# Patient Record
Sex: Female | Born: 1990 | Race: White | Hispanic: No | Marital: Single | Smoking: Never smoker
Health system: Southern US, Community
[De-identification: ages and names within clinical notes are randomized; demographics above are authoritative.]

## PROBLEM LIST (undated history)

## (undated) DIAGNOSIS — F419 Anxiety disorder, unspecified: Secondary | ICD-10-CM

## (undated) DIAGNOSIS — F32A Depression, unspecified: Secondary | ICD-10-CM

## (undated) DIAGNOSIS — F329 Major depressive disorder, single episode, unspecified: Secondary | ICD-10-CM

## (undated) HISTORY — DX: Anxiety disorder, unspecified: F41.9

## (undated) HISTORY — DX: Major depressive disorder, single episode, unspecified: F32.9

## (undated) HISTORY — DX: Depression, unspecified: F32.A

---

## 2015-08-09 ENCOUNTER — Ambulatory Visit (INDEPENDENT_AMBULATORY_CARE_PROVIDER_SITE_OTHER): Payer: 59

## 2015-08-09 ENCOUNTER — Ambulatory Visit (INDEPENDENT_AMBULATORY_CARE_PROVIDER_SITE_OTHER): Payer: 59 | Admitting: Family Medicine

## 2015-08-09 VITALS — BP 130/100 | HR 103 | Temp 99.1°F | Resp 16 | Ht 62.0 in | Wt 199.4 lb

## 2015-08-09 DIAGNOSIS — M79602 Pain in left arm: Secondary | ICD-10-CM

## 2015-08-09 DIAGNOSIS — T148XXA Other injury of unspecified body region, initial encounter: Secondary | ICD-10-CM

## 2015-08-09 NOTE — Progress Notes (Signed)
Chief Complaint:  Chief Complaint  Patient presents with  . Arm Pain    C/O left arm pain since waking up this morning.     HPI: Alison Craig is a 24 y.o. female who reports to [facility] today complaining of left arm pain since this AM, woke up with it. She has had NKI, she has had a fracture when she was small in the same area. She works as a Museum/gallery conservator and has not had anything abnormal at work. She does some heavy lifting but had an easy day before all . Did have some numbness and tingling. No hx of carpal tunnel. She has iced it , she tries not to take advil due to GI sxs. She avoid NSAIDs due to gastritis. Right hand dominant.  Past Medical History  Diagnosis Date  . Anxiety   . Depression    No past surgical history on file. Social History   Social History  . Marital Status: Single    Spouse Name: N/A  . Number of Children: N/A  . Years of Education: N/A   Social History Main Topics  . Smoking status: Never Smoker   . Smokeless tobacco: None  . Alcohol Use: 0.0 oz/week    0 Standard drinks or equivalent per week  . Drug Use: No  . Sexual Activity: Not Asked   Other Topics Concern  . None   Social History Narrative  . None   Family History  Problem Relation Age of Onset  . Mental illness Mother   . Mental illness Brother    Allergies  Allergen Reactions  . Buspirone Rash  . Paxil [Paroxetine] Rash   Prior to Admission medications   Medication Sig Start Date End Date Taking? Authorizing Provider  clonazePAM (KLONOPIN) 0.5 MG tablet Take 0.5 mg by mouth 2 (two) times daily as needed for anxiety.   Yes Historical Provider, MD  sertraline (ZOLOFT) 100 MG tablet Take 150 mg by mouth daily.   Yes Historical Provider, MD  traZODone (DESYREL) 50 MG tablet Take 50 mg by mouth at bedtime.   Yes Historical Provider, MD     ROS: The patient denies fevers, chills, night sweats, unintentional weight loss, chest pain, palpitations, wheezing, dyspnea on exertion,  nausea, vomiting, abdominal pain, dysuria, hematuria, melena, + numbness, weakness, or tingling.   All other systems have been reviewed and were otherwise negative with the exception of those mentioned in the HPI and as above.    PHYSICAL EXAM: Filed Vitals:   08/09/15 1948  BP: 130/100  Pulse: 103  Temp: 99.1 F (37.3 C)  Resp: 16   Body mass index is 36.46 kg/(m^2).   General: Alert, no acute distress HEENT:  Normocephalic, atraumatic, oropharynx patent. EOMI, PERRLA Cardiovascular:  Regular rate and rhythm, no rubs murmurs or gallops.   Respiratory: Clear to auscultation bilaterally.  No wheezes, rales, or rhonchi.  No cyanosis, no use of accessory musculature Abdominal: No organomegaly, abdomen is soft and non-tender, positive bowel sounds. No masses. Skin: No rashes. Neurologic: Facial musculature symmetric. Psychiatric: Patient acts appropriately throughout our interaction. Lymphatic: No cervical or submandibular lymphadenopathy Musculoskeletal: Gait intact.  Radial pulse intact, sensation intact Decrease ROM due to pain Neg for erythema, minimal swelling Neg for warmth Neg Tinels, Phalens and Finkelstein due to pain with ROM   LABS: No results found for this or any previous visit.   EKG/XRAY:   Primary read interpreted by Dr. Conley Rolls at [facility]. Neg for fracture or  dislocation   ASSESSMENT/PLAN: Encounter Diagnoses  Name Primary?  . Left arm pain Yes  . Sprain and strain    Wrist splint Tylenol prn pain RICE prn  Fu prn if no improvement, activity modifications   Gross sideeffects, risk and benefits, and alternatives of medications d/w patient. Patient is aware that all medications have potential sideeffects and we are unable to predict every sideeffect or drug-drug interaction that may occur.  Peityn Payton DO  08/09/2015 8:36 PM

## 2016-10-27 IMAGING — CR DG FOREARM 2V*L*
2 series · 2 of 2 positions shown · non-contrast
Comparison: None.

CLINICAL DATA: Left arm pain.

EXAM:
LEFT FOREARM - 2 VIEW

[AP]
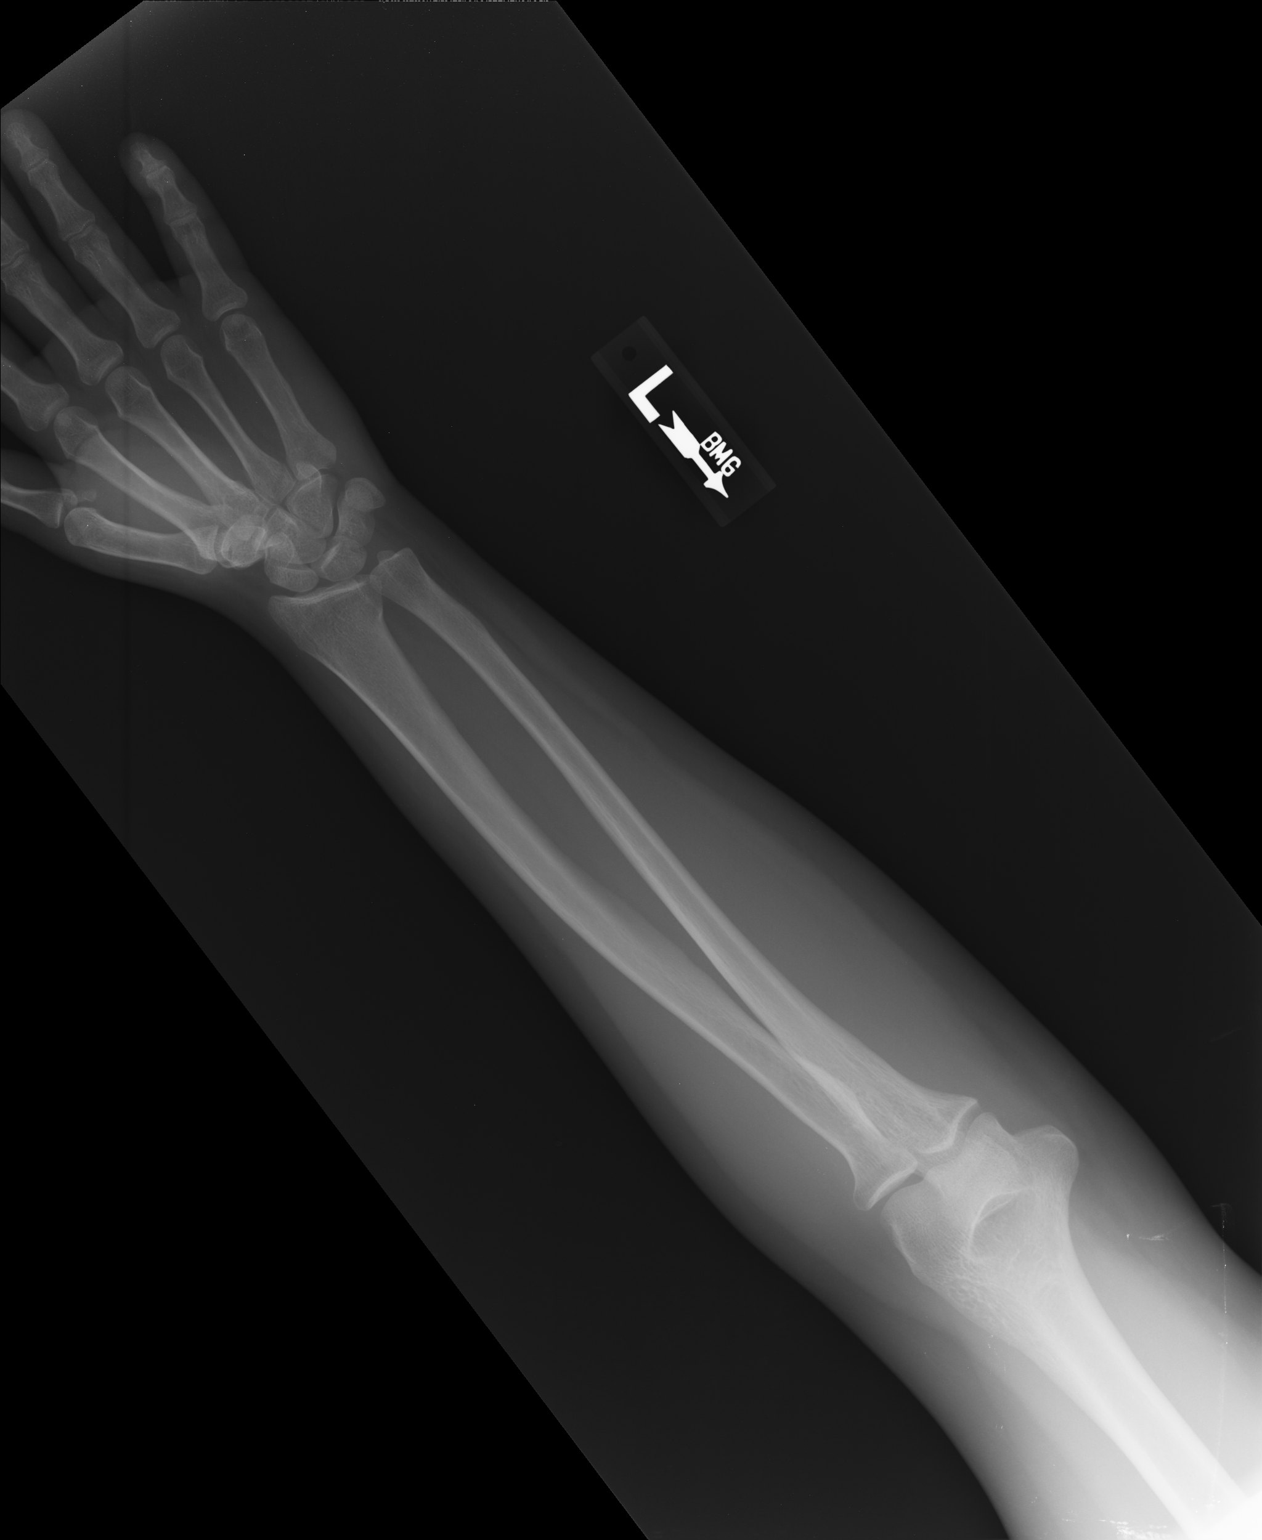

[lateral]
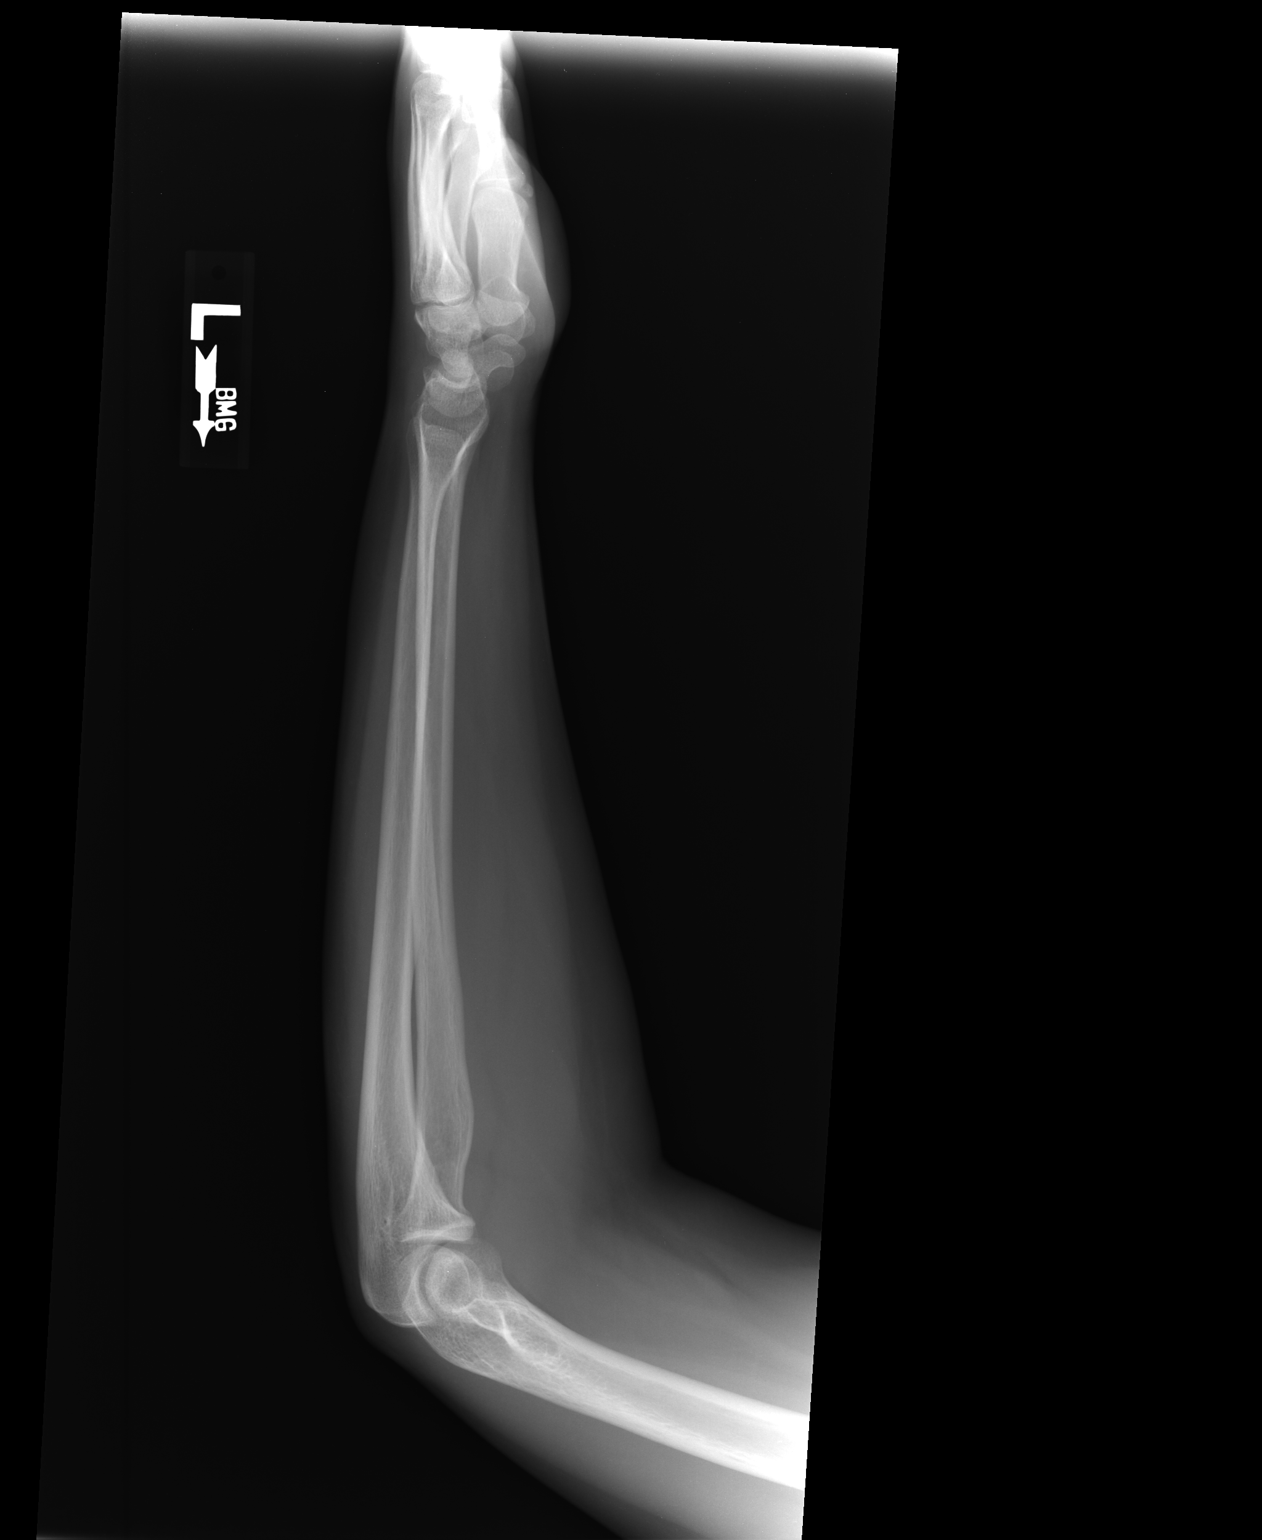

[2 of 2 positions shown; findings below may reference images not displayed]

FINDINGS: There is no evidence of fracture or other focal bone lesions. Soft
tissues are unremarkable.
IMPRESSION: Normal left radius and ulna.
